# Patient Record
Sex: Female | Born: 1950 | Race: White | Hispanic: No | Marital: Married
Health system: Southern US, Community
[De-identification: ages and names within clinical notes are randomized; demographics above are authoritative.]

## PROBLEM LIST (undated history)

## (undated) DIAGNOSIS — E785 Hyperlipidemia, unspecified: Secondary | ICD-10-CM

## (undated) DIAGNOSIS — I10 Essential (primary) hypertension: Secondary | ICD-10-CM

## (undated) DIAGNOSIS — N200 Calculus of kidney: Secondary | ICD-10-CM

## (undated) DIAGNOSIS — C50919 Malignant neoplasm of unspecified site of unspecified female breast: Secondary | ICD-10-CM

## (undated) HISTORY — PX: TONSILLECTOMY: SUR1361

## (undated) HISTORY — PX: MASTECTOMY: SHX3

---

## 1977-06-12 HISTORY — PX: DILATION AND CURETTAGE OF UTERUS: SHX78

## 1984-06-12 HISTORY — PX: TUBAL LIGATION: SHX77

## 2006-06-12 HISTORY — PX: CYSTOSCOPY: SUR368

## 2017-07-18 DIAGNOSIS — Z79899 Other long term (current) drug therapy: Secondary | ICD-10-CM | POA: Diagnosis not present

## 2017-07-25 DIAGNOSIS — Z853 Personal history of malignant neoplasm of breast: Secondary | ICD-10-CM | POA: Diagnosis not present

## 2017-07-25 DIAGNOSIS — R079 Chest pain, unspecified: Secondary | ICD-10-CM | POA: Diagnosis not present

## 2017-07-25 DIAGNOSIS — I341 Nonrheumatic mitral (valve) prolapse: Secondary | ICD-10-CM | POA: Diagnosis not present

## 2017-07-25 DIAGNOSIS — Z780 Asymptomatic menopausal state: Secondary | ICD-10-CM | POA: Diagnosis not present

## 2017-07-25 DIAGNOSIS — R002 Palpitations: Secondary | ICD-10-CM | POA: Diagnosis not present

## 2017-08-15 DIAGNOSIS — I341 Nonrheumatic mitral (valve) prolapse: Secondary | ICD-10-CM | POA: Diagnosis not present

## 2017-08-15 DIAGNOSIS — R002 Palpitations: Secondary | ICD-10-CM | POA: Diagnosis not present

## 2017-08-15 DIAGNOSIS — R079 Chest pain, unspecified: Secondary | ICD-10-CM | POA: Diagnosis not present

## 2017-08-27 DIAGNOSIS — R079 Chest pain, unspecified: Secondary | ICD-10-CM | POA: Diagnosis not present

## 2017-08-27 DIAGNOSIS — R05 Cough: Secondary | ICD-10-CM | POA: Diagnosis not present

## 2018-01-02 DIAGNOSIS — H04123 Dry eye syndrome of bilateral lacrimal glands: Secondary | ICD-10-CM | POA: Diagnosis not present

## 2018-01-02 DIAGNOSIS — H5789 Other specified disorders of eye and adnexa: Secondary | ICD-10-CM | POA: Diagnosis not present

## 2018-01-02 DIAGNOSIS — H0289 Other specified disorders of eyelid: Secondary | ICD-10-CM | POA: Diagnosis not present

## 2018-01-02 DIAGNOSIS — H5712 Ocular pain, left eye: Secondary | ICD-10-CM | POA: Diagnosis not present

## 2018-01-30 DIAGNOSIS — H53001 Unspecified amblyopia, right eye: Secondary | ICD-10-CM | POA: Diagnosis not present

## 2018-01-30 DIAGNOSIS — H43813 Vitreous degeneration, bilateral: Secondary | ICD-10-CM | POA: Diagnosis not present

## 2018-01-30 DIAGNOSIS — H524 Presbyopia: Secondary | ICD-10-CM | POA: Diagnosis not present

## 2018-01-30 DIAGNOSIS — H2513 Age-related nuclear cataract, bilateral: Secondary | ICD-10-CM | POA: Diagnosis not present

## 2018-01-30 DIAGNOSIS — H52223 Regular astigmatism, bilateral: Secondary | ICD-10-CM | POA: Diagnosis not present

## 2018-03-26 DIAGNOSIS — M1710 Unilateral primary osteoarthritis, unspecified knee: Secondary | ICD-10-CM | POA: Diagnosis not present

## 2018-04-17 DIAGNOSIS — R222 Localized swelling, mass and lump, trunk: Secondary | ICD-10-CM | POA: Diagnosis not present

## 2018-05-15 DIAGNOSIS — Z86010 Personal history of colonic polyps: Secondary | ICD-10-CM | POA: Diagnosis not present

## 2018-05-15 DIAGNOSIS — D123 Benign neoplasm of transverse colon: Secondary | ICD-10-CM | POA: Diagnosis not present

## 2018-05-28 DIAGNOSIS — R222 Localized swelling, mass and lump, trunk: Secondary | ICD-10-CM | POA: Diagnosis not present

## 2018-05-30 ENCOUNTER — Other Ambulatory Visit: Payer: Self-pay | Admitting: General Surgery

## 2018-05-30 DIAGNOSIS — R222 Localized swelling, mass and lump, trunk: Secondary | ICD-10-CM

## 2018-06-06 ENCOUNTER — Other Ambulatory Visit: Payer: Self-pay

## 2018-06-21 ENCOUNTER — Ambulatory Visit
Admission: RE | Admit: 2018-06-21 | Discharge: 2018-06-21 | Disposition: A | Discharge status: Home / Self Care | Payer: Medicare Other | Source: Ambulatory Visit | Attending: General Surgery | Admitting: General Surgery

## 2018-06-21 DIAGNOSIS — Z853 Personal history of malignant neoplasm of breast: Secondary | ICD-10-CM | POA: Diagnosis not present

## 2018-06-21 DIAGNOSIS — R222 Localized swelling, mass and lump, trunk: Secondary | ICD-10-CM

## 2018-07-20 DIAGNOSIS — H6091 Unspecified otitis externa, right ear: Secondary | ICD-10-CM | POA: Diagnosis not present

## 2018-07-29 DIAGNOSIS — Z79899 Other long term (current) drug therapy: Secondary | ICD-10-CM | POA: Diagnosis not present

## 2018-07-29 DIAGNOSIS — R8290 Unspecified abnormal findings in urine: Secondary | ICD-10-CM | POA: Diagnosis not present

## 2018-07-29 DIAGNOSIS — Z01419 Encounter for gynecological examination (general) (routine) without abnormal findings: Secondary | ICD-10-CM | POA: Diagnosis not present

## 2018-07-30 DIAGNOSIS — R030 Elevated blood-pressure reading, without diagnosis of hypertension: Secondary | ICD-10-CM | POA: Diagnosis not present

## 2018-07-30 DIAGNOSIS — Z0000 Encounter for general adult medical examination without abnormal findings: Secondary | ICD-10-CM | POA: Diagnosis not present

## 2018-07-30 DIAGNOSIS — H6691 Otitis media, unspecified, right ear: Secondary | ICD-10-CM | POA: Diagnosis not present

## 2018-08-13 DIAGNOSIS — R030 Elevated blood-pressure reading, without diagnosis of hypertension: Secondary | ICD-10-CM | POA: Diagnosis not present

## 2018-08-13 DIAGNOSIS — Z1389 Encounter for screening for other disorder: Secondary | ICD-10-CM | POA: Diagnosis not present

## 2018-08-13 DIAGNOSIS — N200 Calculus of kidney: Secondary | ICD-10-CM | POA: Diagnosis not present

## 2018-08-13 DIAGNOSIS — M8580 Other specified disorders of bone density and structure, unspecified site: Secondary | ICD-10-CM | POA: Diagnosis not present

## 2018-08-13 DIAGNOSIS — I341 Nonrheumatic mitral (valve) prolapse: Secondary | ICD-10-CM | POA: Diagnosis not present

## 2018-08-13 DIAGNOSIS — C50919 Malignant neoplasm of unspecified site of unspecified female breast: Secondary | ICD-10-CM | POA: Diagnosis not present

## 2018-11-14 DIAGNOSIS — D1801 Hemangioma of skin and subcutaneous tissue: Secondary | ICD-10-CM | POA: Diagnosis not present

## 2018-11-14 DIAGNOSIS — Z1283 Encounter for screening for malignant neoplasm of skin: Secondary | ICD-10-CM | POA: Diagnosis not present

## 2018-11-14 DIAGNOSIS — L818 Other specified disorders of pigmentation: Secondary | ICD-10-CM | POA: Diagnosis not present

## 2018-11-14 DIAGNOSIS — L820 Inflamed seborrheic keratosis: Secondary | ICD-10-CM | POA: Diagnosis not present

## 2018-11-14 DIAGNOSIS — D2261 Melanocytic nevi of right upper limb, including shoulder: Secondary | ICD-10-CM | POA: Diagnosis not present

## 2019-01-21 DIAGNOSIS — L820 Inflamed seborrheic keratosis: Secondary | ICD-10-CM | POA: Diagnosis not present

## 2019-02-18 DIAGNOSIS — I10 Essential (primary) hypertension: Secondary | ICD-10-CM | POA: Diagnosis not present

## 2019-02-18 DIAGNOSIS — C50919 Malignant neoplasm of unspecified site of unspecified female breast: Secondary | ICD-10-CM | POA: Diagnosis not present

## 2019-02-18 DIAGNOSIS — Z23 Encounter for immunization: Secondary | ICD-10-CM | POA: Diagnosis not present

## 2019-02-18 DIAGNOSIS — M8580 Other specified disorders of bone density and structure, unspecified site: Secondary | ICD-10-CM | POA: Diagnosis not present

## 2019-02-18 DIAGNOSIS — I341 Nonrheumatic mitral (valve) prolapse: Secondary | ICD-10-CM | POA: Diagnosis not present

## 2019-02-18 DIAGNOSIS — Z136 Encounter for screening for cardiovascular disorders: Secondary | ICD-10-CM | POA: Diagnosis not present

## 2019-04-25 DIAGNOSIS — Z20828 Contact with and (suspected) exposure to other viral communicable diseases: Secondary | ICD-10-CM | POA: Diagnosis not present

## 2019-07-21 ENCOUNTER — Ambulatory Visit: Payer: Medicare Other | Attending: Internal Medicine

## 2019-07-21 DIAGNOSIS — Z23 Encounter for immunization: Secondary | ICD-10-CM

## 2019-07-21 NOTE — Progress Notes (Signed)
   Covid-19 Vaccination Clinic  Name:  Tracy Barnett    MRN: [id number] DOB: [birth year]  07/21/2019  [name] was observed post Covid-19 immunization for 15 minutes without incidence. She was provided with Vaccine Information Sheet and instruction to access the V-Safe system.   [name] was instructed to call 911 with any severe reactions post vaccine: Marland Kitchen Difficulty breathing  . Swelling of your face and throat  . A fast heartbeat  . A bad rash all over your body  . Dizziness and weakness    Immunizations Administered    Name Date Dose VIS Date Route   Pfizer COVID-19 Vaccine 07/21/2019  9:48 AM 0.3 mL 05/23/2019 Intramuscular   Manufacturer: [place]   Lot: [id number]   [place]: [id number]

## 2019-07-23 DIAGNOSIS — Z01419 Encounter for gynecological examination (general) (routine) without abnormal findings: Secondary | ICD-10-CM | POA: Diagnosis not present

## 2019-07-23 DIAGNOSIS — Z6827 Body mass index (BMI) 27.0-27.9, adult: Secondary | ICD-10-CM | POA: Diagnosis not present

## 2019-08-13 ENCOUNTER — Ambulatory Visit: Payer: Medicare Other | Attending: Internal Medicine

## 2019-08-13 DIAGNOSIS — Z23 Encounter for immunization: Secondary | ICD-10-CM | POA: Insufficient documentation

## 2019-08-13 NOTE — Progress Notes (Signed)
   Covid-19 Vaccination Clinic  Name:  Tracy Barnett    MRN: [id number] DOB: Nov 19, 1950  08/13/2019  [name] was observed post Covid-19 immunization for 15 minutes without incident. She was provided with Vaccine Information Sheet and instruction to access the V-Safe system.   [name] was instructed to call 911 with any severe reactions post vaccine: Marland Kitchen Difficulty breathing  . Swelling of face and throat  . A fast heartbeat  . A bad rash all over body  . Dizziness and weakness   Immunizations Administered    Name Date Dose VIS Date Route   Pfizer COVID-19 Vaccine 08/13/2019 10:13 AM 0.3 mL 05/23/2019 Intramuscular   Manufacturer: [place]   Lot: [id number]   [place]: [id number]

## 2019-11-13 DIAGNOSIS — L820 Inflamed seborrheic keratosis: Secondary | ICD-10-CM | POA: Diagnosis not present

## 2019-11-13 DIAGNOSIS — L309 Dermatitis, unspecified: Secondary | ICD-10-CM | POA: Diagnosis not present

## 2019-11-13 DIAGNOSIS — L821 Other seborrheic keratosis: Secondary | ICD-10-CM | POA: Diagnosis not present

## 2019-11-13 DIAGNOSIS — B079 Viral wart, unspecified: Secondary | ICD-10-CM | POA: Diagnosis not present

## 2019-11-13 DIAGNOSIS — Z1283 Encounter for screening for malignant neoplasm of skin: Secondary | ICD-10-CM | POA: Diagnosis not present

## 2019-11-13 DIAGNOSIS — D1801 Hemangioma of skin and subcutaneous tissue: Secondary | ICD-10-CM | POA: Diagnosis not present

## 2020-03-05 DIAGNOSIS — Z23 Encounter for immunization: Secondary | ICD-10-CM | POA: Diagnosis not present

## 2020-04-01 DIAGNOSIS — Z23 Encounter for immunization: Secondary | ICD-10-CM | POA: Diagnosis not present

## 2020-06-25 DIAGNOSIS — H2513 Age-related nuclear cataract, bilateral: Secondary | ICD-10-CM | POA: Diagnosis not present

## 2020-06-25 DIAGNOSIS — H5212 Myopia, left eye: Secondary | ICD-10-CM | POA: Diagnosis not present

## 2020-06-25 DIAGNOSIS — H353131 Nonexudative age-related macular degeneration, bilateral, early dry stage: Secondary | ICD-10-CM | POA: Diagnosis not present

## 2020-08-10 ENCOUNTER — Ambulatory Visit
Admission: RE | Admit: 2020-08-10 | Discharge: 2020-08-10 | Disposition: A | Discharge status: Home / Self Care | Payer: Medicare Other | Source: Ambulatory Visit | Attending: Internal Medicine | Admitting: Internal Medicine

## 2020-08-10 ENCOUNTER — Other Ambulatory Visit: Payer: Self-pay | Admitting: Internal Medicine

## 2020-08-10 DIAGNOSIS — M25559 Pain in unspecified hip: Secondary | ICD-10-CM

## 2020-08-10 DIAGNOSIS — M25552 Pain in left hip: Secondary | ICD-10-CM | POA: Diagnosis not present

## 2020-08-10 DIAGNOSIS — M7062 Trochanteric bursitis, left hip: Secondary | ICD-10-CM | POA: Diagnosis not present

## 2020-09-19 DIAGNOSIS — I10 Essential (primary) hypertension: Secondary | ICD-10-CM | POA: Diagnosis not present

## 2020-09-19 DIAGNOSIS — F514 Sleep terrors [night terrors]: Secondary | ICD-10-CM | POA: Diagnosis not present

## 2020-09-22 DIAGNOSIS — R002 Palpitations: Secondary | ICD-10-CM | POA: Diagnosis not present

## 2020-09-22 DIAGNOSIS — I10 Essential (primary) hypertension: Secondary | ICD-10-CM | POA: Diagnosis not present

## 2020-09-22 DIAGNOSIS — F411 Generalized anxiety disorder: Secondary | ICD-10-CM | POA: Diagnosis not present

## 2020-10-12 DIAGNOSIS — I10 Essential (primary) hypertension: Secondary | ICD-10-CM | POA: Diagnosis not present

## 2020-10-12 DIAGNOSIS — F411 Generalized anxiety disorder: Secondary | ICD-10-CM | POA: Diagnosis not present

## 2020-11-26 DIAGNOSIS — D229 Melanocytic nevi, unspecified: Secondary | ICD-10-CM | POA: Diagnosis not present

## 2020-11-26 DIAGNOSIS — D1801 Hemangioma of skin and subcutaneous tissue: Secondary | ICD-10-CM | POA: Diagnosis not present

## 2020-11-26 DIAGNOSIS — Z1283 Encounter for screening for malignant neoplasm of skin: Secondary | ICD-10-CM | POA: Diagnosis not present

## 2020-11-26 DIAGNOSIS — L820 Inflamed seborrheic keratosis: Secondary | ICD-10-CM | POA: Diagnosis not present

## 2020-11-26 DIAGNOSIS — L821 Other seborrheic keratosis: Secondary | ICD-10-CM | POA: Diagnosis not present

## 2020-12-10 DIAGNOSIS — Z853 Personal history of malignant neoplasm of breast: Secondary | ICD-10-CM | POA: Diagnosis not present

## 2020-12-10 DIAGNOSIS — M859 Disorder of bone density and structure, unspecified: Secondary | ICD-10-CM | POA: Diagnosis not present

## 2020-12-10 DIAGNOSIS — Z0000 Encounter for general adult medical examination without abnormal findings: Secondary | ICD-10-CM | POA: Diagnosis not present

## 2020-12-10 DIAGNOSIS — E785 Hyperlipidemia, unspecified: Secondary | ICD-10-CM | POA: Diagnosis not present

## 2020-12-10 DIAGNOSIS — I10 Essential (primary) hypertension: Secondary | ICD-10-CM | POA: Diagnosis not present

## 2020-12-10 DIAGNOSIS — Z1389 Encounter for screening for other disorder: Secondary | ICD-10-CM | POA: Diagnosis not present

## 2020-12-10 DIAGNOSIS — M8580 Other specified disorders of bone density and structure, unspecified site: Secondary | ICD-10-CM | POA: Diagnosis not present

## 2021-01-17 DIAGNOSIS — R7989 Other specified abnormal findings of blood chemistry: Secondary | ICD-10-CM | POA: Diagnosis not present

## 2021-01-17 DIAGNOSIS — R946 Abnormal results of thyroid function studies: Secondary | ICD-10-CM | POA: Diagnosis not present

## 2021-02-28 ENCOUNTER — Other Ambulatory Visit: Payer: Self-pay | Admitting: Internal Medicine

## 2021-02-28 DIAGNOSIS — M8580 Other specified disorders of bone density and structure, unspecified site: Secondary | ICD-10-CM

## 2021-03-01 ENCOUNTER — Other Ambulatory Visit: Payer: Self-pay | Admitting: Internal Medicine

## 2021-03-01 ENCOUNTER — Other Ambulatory Visit: Payer: Self-pay | Admitting: Nurse Practitioner

## 2021-03-01 DIAGNOSIS — N6489 Other specified disorders of breast: Secondary | ICD-10-CM

## 2021-03-01 DIAGNOSIS — M8580 Other specified disorders of bone density and structure, unspecified site: Secondary | ICD-10-CM

## 2021-03-02 ENCOUNTER — Other Ambulatory Visit: Payer: Self-pay | Admitting: Internal Medicine

## 2021-03-02 DIAGNOSIS — M85852 Other specified disorders of bone density and structure, left thigh: Secondary | ICD-10-CM

## 2021-03-07 ENCOUNTER — Other Ambulatory Visit: Payer: Self-pay

## 2021-03-07 ENCOUNTER — Ambulatory Visit
Admission: RE | Admit: 2021-03-07 | Discharge: 2021-03-07 | Disposition: A | Discharge status: Home / Self Care | Payer: Medicare Other | Source: Ambulatory Visit | Attending: Internal Medicine | Admitting: Internal Medicine

## 2021-03-07 DIAGNOSIS — M85852 Other specified disorders of bone density and structure, left thigh: Secondary | ICD-10-CM | POA: Insufficient documentation

## 2021-03-07 DIAGNOSIS — M8589 Other specified disorders of bone density and structure, multiple sites: Secondary | ICD-10-CM | POA: Diagnosis not present

## 2021-03-10 DIAGNOSIS — Z23 Encounter for immunization: Secondary | ICD-10-CM | POA: Diagnosis not present

## 2021-04-14 IMAGING — DX DG HIP (WITH OR WITHOUT PELVIS) 2-3V*L*
2 series · 2 of 2 positions shown · non-contrast
Comparison: None.

CLINICAL DATA: Left hip pain for 6 weeks.

EXAM:
DG HIP (WITH OR WITHOUT PELVIS) 2-3V LEFT

[dg hip unilat w or w/o pelvis 2-3 views  (1 of 2)]
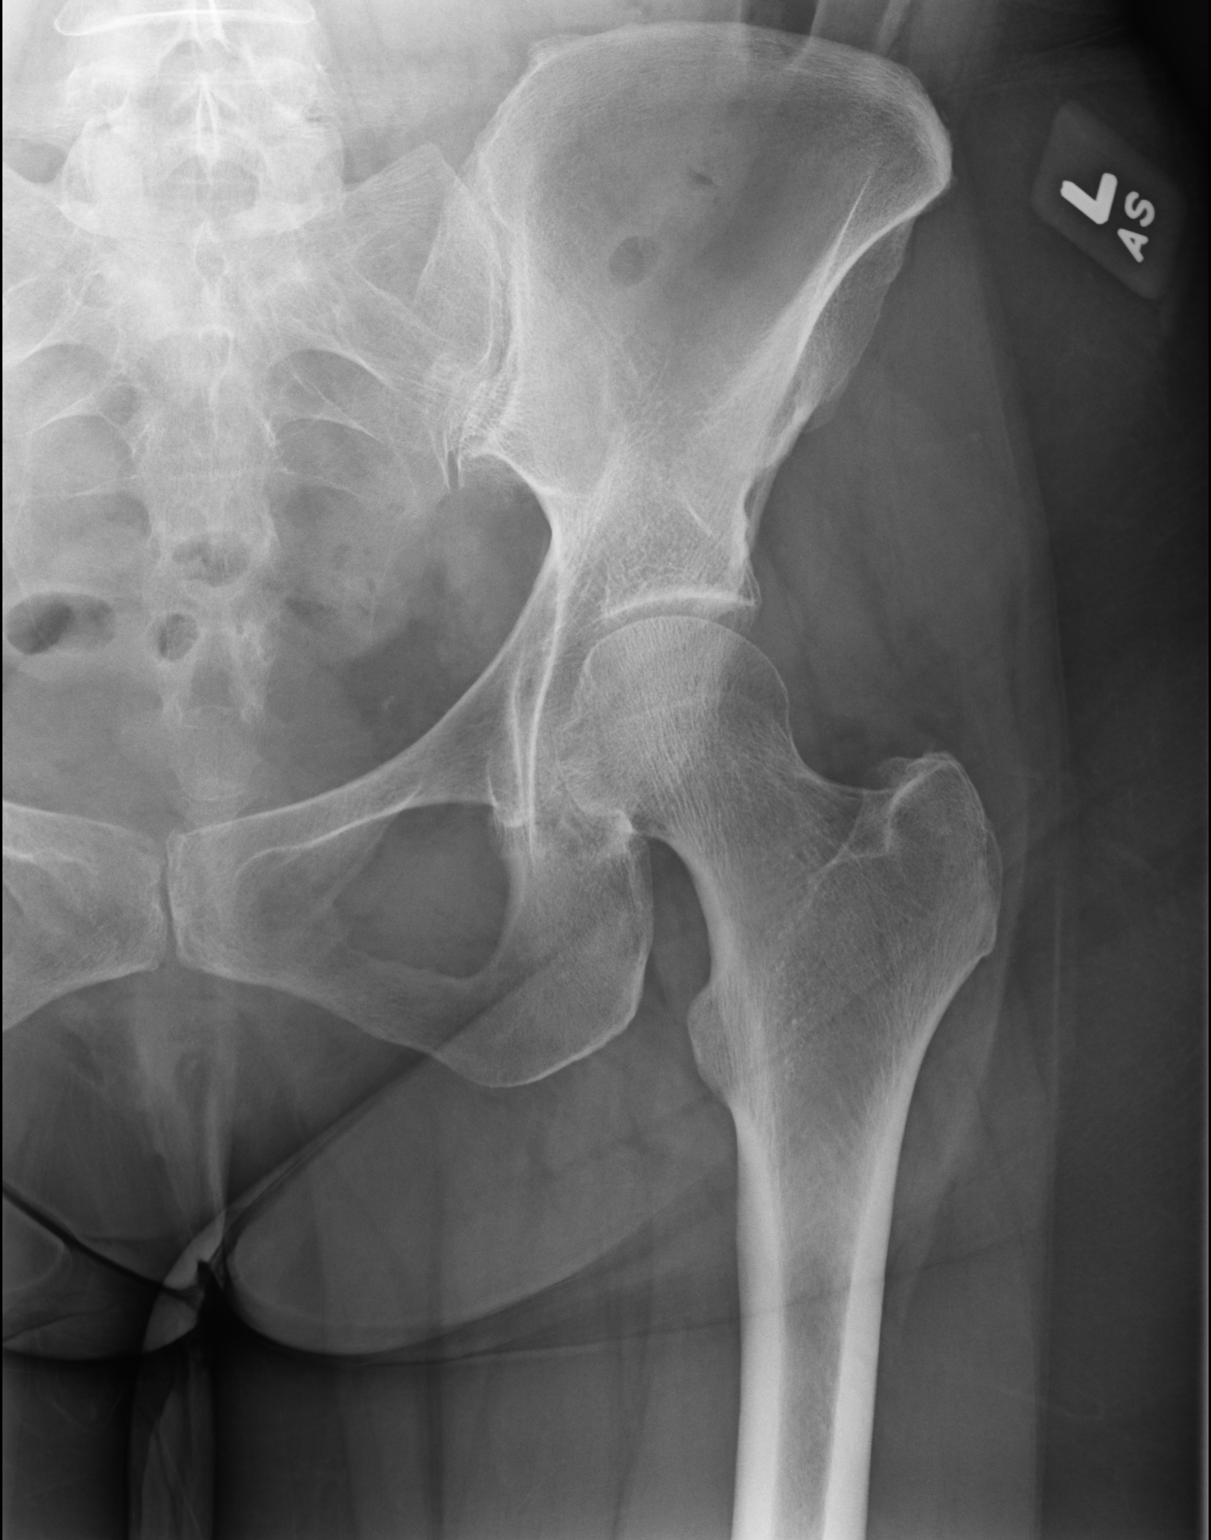

[dg hip unilat w or w/o pelvis 2-3 views  (2 of 2)]
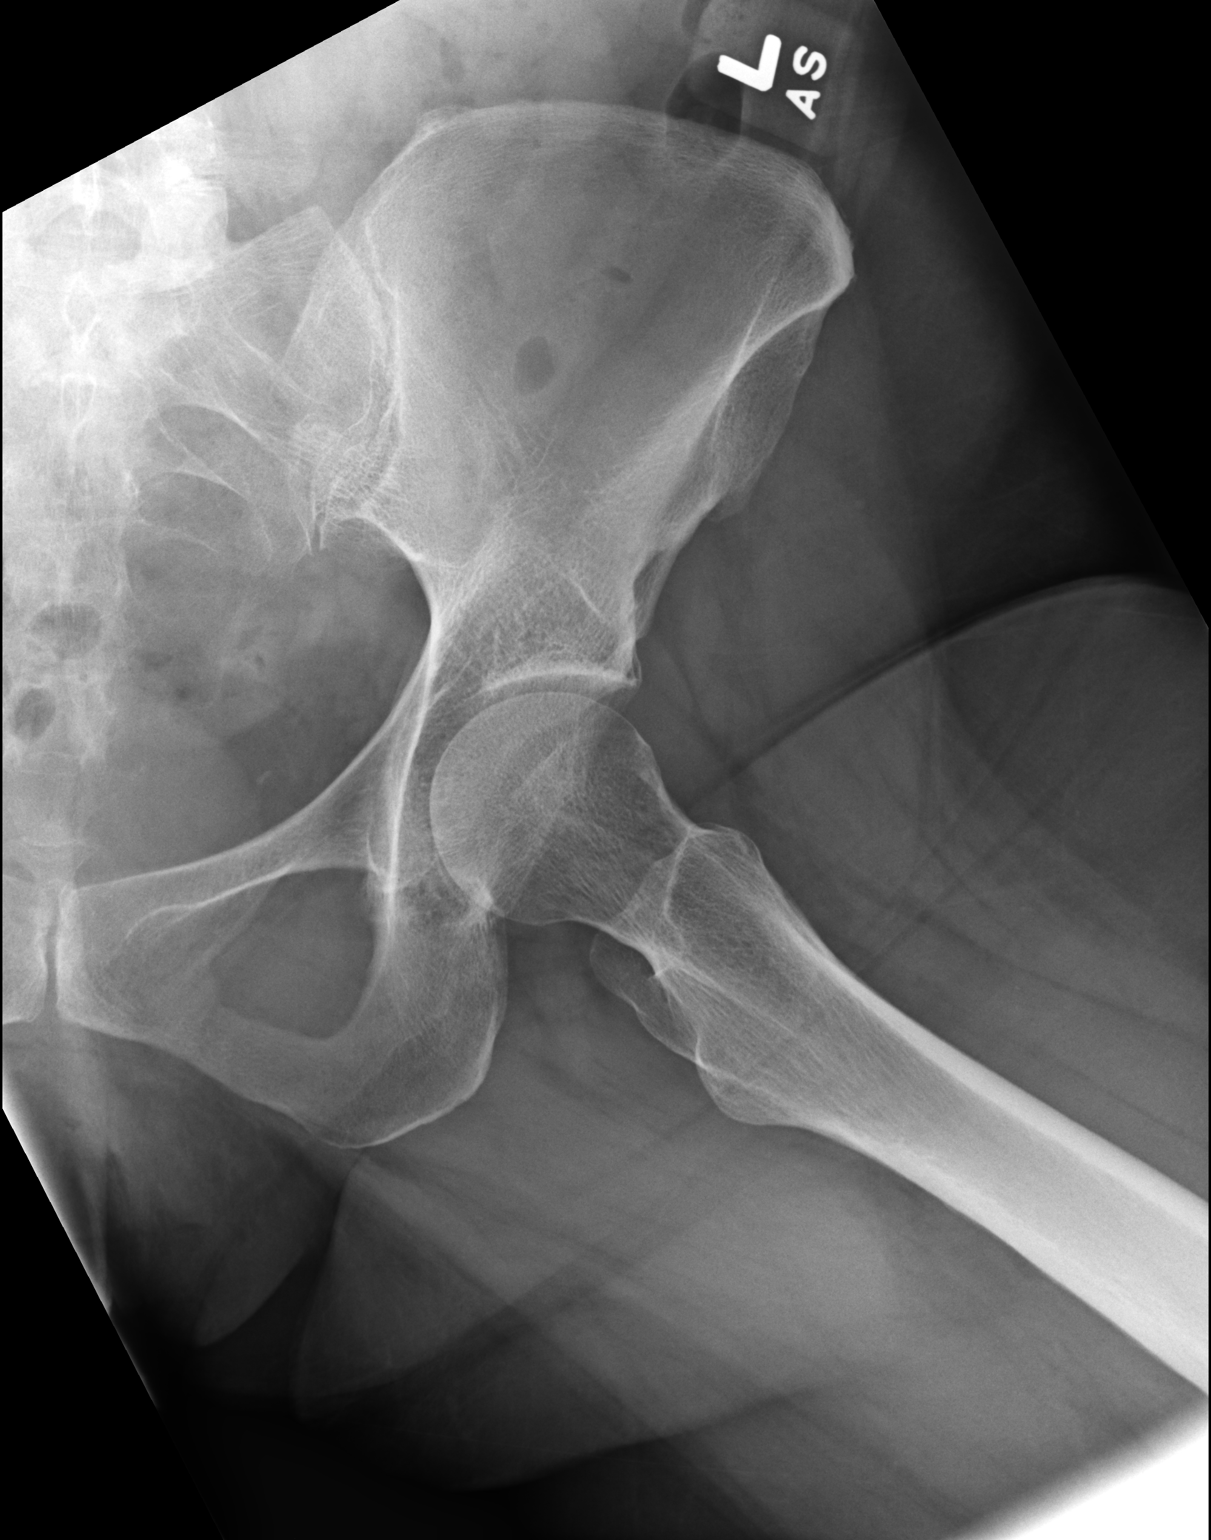

[2 of 2 positions shown; findings below may reference images not displayed]

FINDINGS: AP and frogleg lateral views of the left hip obtained. Left hip
joint space is preserved. No significant osteophytes. Femoral head
is well seated. No fracture, evidence of a vascular necrosis, or
focal bone lesion. Pubic rami are intact. The pubic symphysis is
congruent. Left sacroiliac joint is normal. No soft tissue
abnormalities.
IMPRESSION: Negative radiographs of the left hip.

## 2021-06-12 HISTORY — PX: CATARACT EXTRACTION: SUR2

## 2021-06-23 DIAGNOSIS — Z23 Encounter for immunization: Secondary | ICD-10-CM | POA: Diagnosis not present

## 2021-06-27 DIAGNOSIS — H353131 Nonexudative age-related macular degeneration, bilateral, early dry stage: Secondary | ICD-10-CM | POA: Diagnosis not present

## 2021-06-27 DIAGNOSIS — H2513 Age-related nuclear cataract, bilateral: Secondary | ICD-10-CM | POA: Diagnosis not present

## 2021-06-27 DIAGNOSIS — H5212 Myopia, left eye: Secondary | ICD-10-CM | POA: Diagnosis not present

## 2021-07-27 DIAGNOSIS — Z124 Encounter for screening for malignant neoplasm of cervix: Secondary | ICD-10-CM | POA: Diagnosis not present

## 2021-07-27 DIAGNOSIS — N952 Postmenopausal atrophic vaginitis: Secondary | ICD-10-CM | POA: Diagnosis not present

## 2021-07-27 DIAGNOSIS — Z01419 Encounter for gynecological examination (general) (routine) without abnormal findings: Secondary | ICD-10-CM | POA: Diagnosis not present

## 2021-09-10 DIAGNOSIS — I10 Essential (primary) hypertension: Secondary | ICD-10-CM | POA: Diagnosis not present

## 2021-09-10 DIAGNOSIS — R0789 Other chest pain: Secondary | ICD-10-CM | POA: Diagnosis not present

## 2021-09-20 DIAGNOSIS — E038 Other specified hypothyroidism: Secondary | ICD-10-CM | POA: Diagnosis not present

## 2021-09-20 DIAGNOSIS — I10 Essential (primary) hypertension: Secondary | ICD-10-CM | POA: Diagnosis not present

## 2021-09-20 DIAGNOSIS — G250 Essential tremor: Secondary | ICD-10-CM | POA: Diagnosis not present

## 2021-12-22 DIAGNOSIS — E038 Other specified hypothyroidism: Secondary | ICD-10-CM | POA: Diagnosis not present

## 2021-12-22 DIAGNOSIS — I341 Nonrheumatic mitral (valve) prolapse: Secondary | ICD-10-CM | POA: Diagnosis not present

## 2021-12-22 DIAGNOSIS — G250 Essential tremor: Secondary | ICD-10-CM | POA: Diagnosis not present

## 2021-12-22 DIAGNOSIS — M8580 Other specified disorders of bone density and structure, unspecified site: Secondary | ICD-10-CM | POA: Diagnosis not present

## 2021-12-22 DIAGNOSIS — I10 Essential (primary) hypertension: Secondary | ICD-10-CM | POA: Diagnosis not present

## 2021-12-22 DIAGNOSIS — F411 Generalized anxiety disorder: Secondary | ICD-10-CM | POA: Diagnosis not present

## 2021-12-22 DIAGNOSIS — E785 Hyperlipidemia, unspecified: Secondary | ICD-10-CM | POA: Diagnosis not present

## 2021-12-22 DIAGNOSIS — Z0000 Encounter for general adult medical examination without abnormal findings: Secondary | ICD-10-CM | POA: Diagnosis not present

## 2021-12-22 DIAGNOSIS — Z853 Personal history of malignant neoplasm of breast: Secondary | ICD-10-CM | POA: Diagnosis not present

## 2021-12-30 DIAGNOSIS — H2512 Age-related nuclear cataract, left eye: Secondary | ICD-10-CM | POA: Diagnosis not present

## 2022-01-12 DIAGNOSIS — Z1283 Encounter for screening for malignant neoplasm of skin: Secondary | ICD-10-CM | POA: Diagnosis not present

## 2022-01-12 DIAGNOSIS — L570 Actinic keratosis: Secondary | ICD-10-CM | POA: Diagnosis not present

## 2022-01-12 DIAGNOSIS — D2361 Other benign neoplasm of skin of right upper limb, including shoulder: Secondary | ICD-10-CM | POA: Diagnosis not present

## 2022-01-12 DIAGNOSIS — L820 Inflamed seborrheic keratosis: Secondary | ICD-10-CM | POA: Diagnosis not present

## 2022-01-12 DIAGNOSIS — L821 Other seborrheic keratosis: Secondary | ICD-10-CM | POA: Diagnosis not present

## 2022-01-12 DIAGNOSIS — D1801 Hemangioma of skin and subcutaneous tissue: Secondary | ICD-10-CM | POA: Diagnosis not present

## 2022-01-18 DIAGNOSIS — H269 Unspecified cataract: Secondary | ICD-10-CM | POA: Diagnosis not present

## 2022-01-18 DIAGNOSIS — H2512 Age-related nuclear cataract, left eye: Secondary | ICD-10-CM | POA: Diagnosis not present

## 2022-03-06 DIAGNOSIS — Z23 Encounter for immunization: Secondary | ICD-10-CM | POA: Diagnosis not present

## 2022-03-10 DIAGNOSIS — H04123 Dry eye syndrome of bilateral lacrimal glands: Secondary | ICD-10-CM | POA: Diagnosis not present

## 2022-06-29 DIAGNOSIS — I341 Nonrheumatic mitral (valve) prolapse: Secondary | ICD-10-CM | POA: Diagnosis not present

## 2022-06-29 DIAGNOSIS — Z8249 Family history of ischemic heart disease and other diseases of the circulatory system: Secondary | ICD-10-CM | POA: Diagnosis not present

## 2022-06-29 DIAGNOSIS — F411 Generalized anxiety disorder: Secondary | ICD-10-CM | POA: Diagnosis not present

## 2022-06-29 DIAGNOSIS — Z853 Personal history of malignant neoplasm of breast: Secondary | ICD-10-CM | POA: Diagnosis not present

## 2022-06-29 DIAGNOSIS — H9190 Unspecified hearing loss, unspecified ear: Secondary | ICD-10-CM | POA: Diagnosis not present

## 2022-06-29 DIAGNOSIS — E785 Hyperlipidemia, unspecified: Secondary | ICD-10-CM | POA: Diagnosis not present

## 2022-06-29 DIAGNOSIS — G250 Essential tremor: Secondary | ICD-10-CM | POA: Diagnosis not present

## 2022-06-29 DIAGNOSIS — E038 Other specified hypothyroidism: Secondary | ICD-10-CM | POA: Diagnosis not present

## 2022-06-29 DIAGNOSIS — I10 Essential (primary) hypertension: Secondary | ICD-10-CM | POA: Diagnosis not present

## 2022-07-04 ENCOUNTER — Other Ambulatory Visit: Payer: Self-pay | Admitting: Internal Medicine

## 2022-07-04 DIAGNOSIS — Z8249 Family history of ischemic heart disease and other diseases of the circulatory system: Secondary | ICD-10-CM

## 2022-07-12 ENCOUNTER — Ambulatory Visit
Admission: RE | Admit: 2022-07-12 | Discharge: 2022-07-12 | Disposition: A | Discharge status: Home / Self Care | Payer: Medicare Other | Source: Ambulatory Visit | Attending: Internal Medicine | Admitting: Internal Medicine

## 2022-07-12 DIAGNOSIS — Z8249 Family history of ischemic heart disease and other diseases of the circulatory system: Secondary | ICD-10-CM

## 2022-07-12 DIAGNOSIS — I700 Atherosclerosis of aorta: Secondary | ICD-10-CM | POA: Diagnosis not present

## 2022-08-29 DIAGNOSIS — H2511 Age-related nuclear cataract, right eye: Secondary | ICD-10-CM | POA: Diagnosis not present

## 2022-08-29 DIAGNOSIS — H353132 Nonexudative age-related macular degeneration, bilateral, intermediate dry stage: Secondary | ICD-10-CM | POA: Diagnosis not present

## 2022-09-29 DIAGNOSIS — H9313 Tinnitus, bilateral: Secondary | ICD-10-CM | POA: Diagnosis not present

## 2022-09-29 DIAGNOSIS — H6121 Impacted cerumen, right ear: Secondary | ICD-10-CM | POA: Diagnosis not present

## 2022-09-29 DIAGNOSIS — H906 Mixed conductive and sensorineural hearing loss, bilateral: Secondary | ICD-10-CM | POA: Diagnosis not present

## 2022-09-29 DIAGNOSIS — H65491 Other chronic nonsuppurative otitis media, right ear: Secondary | ICD-10-CM | POA: Diagnosis not present

## 2022-09-29 DIAGNOSIS — H6991 Unspecified Eustachian tube disorder, right ear: Secondary | ICD-10-CM | POA: Diagnosis not present

## 2022-10-11 DIAGNOSIS — H65491 Other chronic nonsuppurative otitis media, right ear: Secondary | ICD-10-CM | POA: Diagnosis not present

## 2022-10-11 DIAGNOSIS — H906 Mixed conductive and sensorineural hearing loss, bilateral: Secondary | ICD-10-CM | POA: Diagnosis not present

## 2022-11-15 DIAGNOSIS — H65491 Other chronic nonsuppurative otitis media, right ear: Secondary | ICD-10-CM | POA: Diagnosis not present

## 2022-11-15 DIAGNOSIS — Z9622 Myringotomy tube(s) status: Secondary | ICD-10-CM | POA: Diagnosis not present

## 2022-11-15 DIAGNOSIS — M26609 Unspecified temporomandibular joint disorder, unspecified side: Secondary | ICD-10-CM | POA: Diagnosis not present

## 2022-11-15 DIAGNOSIS — H906 Mixed conductive and sensorineural hearing loss, bilateral: Secondary | ICD-10-CM | POA: Diagnosis not present

## 2023-02-13 ENCOUNTER — Other Ambulatory Visit: Payer: Self-pay | Admitting: Internal Medicine

## 2023-02-13 DIAGNOSIS — I10 Essential (primary) hypertension: Secondary | ICD-10-CM | POA: Diagnosis not present

## 2023-02-13 DIAGNOSIS — F411 Generalized anxiety disorder: Secondary | ICD-10-CM | POA: Diagnosis not present

## 2023-02-13 DIAGNOSIS — K769 Liver disease, unspecified: Secondary | ICD-10-CM | POA: Diagnosis not present

## 2023-02-13 DIAGNOSIS — Z853 Personal history of malignant neoplasm of breast: Secondary | ICD-10-CM | POA: Diagnosis not present

## 2023-02-13 DIAGNOSIS — G250 Essential tremor: Secondary | ICD-10-CM | POA: Diagnosis not present

## 2023-02-13 DIAGNOSIS — I341 Nonrheumatic mitral (valve) prolapse: Secondary | ICD-10-CM | POA: Diagnosis not present

## 2023-02-13 DIAGNOSIS — Z0000 Encounter for general adult medical examination without abnormal findings: Secondary | ICD-10-CM | POA: Diagnosis not present

## 2023-02-13 DIAGNOSIS — E785 Hyperlipidemia, unspecified: Secondary | ICD-10-CM | POA: Diagnosis not present

## 2023-02-13 DIAGNOSIS — M8580 Other specified disorders of bone density and structure, unspecified site: Secondary | ICD-10-CM | POA: Diagnosis not present

## 2023-02-13 DIAGNOSIS — E038 Other specified hypothyroidism: Secondary | ICD-10-CM | POA: Diagnosis not present

## 2023-02-14 DIAGNOSIS — B351 Tinea unguium: Secondary | ICD-10-CM | POA: Diagnosis not present

## 2023-02-14 DIAGNOSIS — D1801 Hemangioma of skin and subcutaneous tissue: Secondary | ICD-10-CM | POA: Diagnosis not present

## 2023-02-14 DIAGNOSIS — L820 Inflamed seborrheic keratosis: Secondary | ICD-10-CM | POA: Diagnosis not present

## 2023-02-14 DIAGNOSIS — L570 Actinic keratosis: Secondary | ICD-10-CM | POA: Diagnosis not present

## 2023-02-14 DIAGNOSIS — L578 Other skin changes due to chronic exposure to nonionizing radiation: Secondary | ICD-10-CM | POA: Diagnosis not present

## 2023-02-14 DIAGNOSIS — L298 Other pruritus: Secondary | ICD-10-CM | POA: Diagnosis not present

## 2023-02-14 DIAGNOSIS — L538 Other specified erythematous conditions: Secondary | ICD-10-CM | POA: Diagnosis not present

## 2023-02-14 DIAGNOSIS — L821 Other seborrheic keratosis: Secondary | ICD-10-CM | POA: Diagnosis not present

## 2023-02-15 ENCOUNTER — Ambulatory Visit
Admission: RE | Admit: 2023-02-15 | Discharge: 2023-02-15 | Disposition: A | Discharge status: Home / Self Care | Payer: Medicare Other | Source: Ambulatory Visit | Attending: Internal Medicine | Admitting: Internal Medicine

## 2023-02-15 DIAGNOSIS — K769 Liver disease, unspecified: Secondary | ICD-10-CM

## 2023-02-15 DIAGNOSIS — K7689 Other specified diseases of liver: Secondary | ICD-10-CM | POA: Diagnosis not present

## 2023-02-15 DIAGNOSIS — K8020 Calculus of gallbladder without cholecystitis without obstruction: Secondary | ICD-10-CM | POA: Diagnosis not present

## 2023-03-26 DIAGNOSIS — Z23 Encounter for immunization: Secondary | ICD-10-CM | POA: Diagnosis not present

## 2023-05-01 DIAGNOSIS — Z9622 Myringotomy tube(s) status: Secondary | ICD-10-CM | POA: Diagnosis not present

## 2023-05-01 DIAGNOSIS — H65491 Other chronic nonsuppurative otitis media, right ear: Secondary | ICD-10-CM | POA: Diagnosis not present

## 2023-05-29 DIAGNOSIS — Z860101 Personal history of adenomatous and serrated colon polyps: Secondary | ICD-10-CM | POA: Diagnosis not present

## 2023-05-29 DIAGNOSIS — D126 Benign neoplasm of colon, unspecified: Secondary | ICD-10-CM | POA: Diagnosis not present

## 2023-05-29 DIAGNOSIS — Z860100 Personal history of colon polyps, unspecified: Secondary | ICD-10-CM | POA: Diagnosis not present

## 2023-05-29 DIAGNOSIS — K635 Polyp of colon: Secondary | ICD-10-CM | POA: Diagnosis not present

## 2023-05-29 DIAGNOSIS — Z1211 Encounter for screening for malignant neoplasm of colon: Secondary | ICD-10-CM | POA: Diagnosis not present

## 2023-06-27 DIAGNOSIS — M79672 Pain in left foot: Secondary | ICD-10-CM | POA: Diagnosis not present

## 2023-07-31 DIAGNOSIS — Z1330 Encounter for screening examination for mental health and behavioral disorders, unspecified: Secondary | ICD-10-CM | POA: Diagnosis not present

## 2023-07-31 DIAGNOSIS — N952 Postmenopausal atrophic vaginitis: Secondary | ICD-10-CM | POA: Diagnosis not present

## 2023-07-31 DIAGNOSIS — Z124 Encounter for screening for malignant neoplasm of cervix: Secondary | ICD-10-CM | POA: Diagnosis not present

## 2023-07-31 DIAGNOSIS — Z853 Personal history of malignant neoplasm of breast: Secondary | ICD-10-CM | POA: Diagnosis not present

## 2023-07-31 DIAGNOSIS — Z01419 Encounter for gynecological examination (general) (routine) without abnormal findings: Secondary | ICD-10-CM | POA: Diagnosis not present

## 2023-08-14 DIAGNOSIS — Z23 Encounter for immunization: Secondary | ICD-10-CM | POA: Diagnosis not present

## 2023-08-14 DIAGNOSIS — E785 Hyperlipidemia, unspecified: Secondary | ICD-10-CM | POA: Diagnosis not present

## 2023-08-14 DIAGNOSIS — E038 Other specified hypothyroidism: Secondary | ICD-10-CM | POA: Diagnosis not present

## 2023-08-14 DIAGNOSIS — I341 Nonrheumatic mitral (valve) prolapse: Secondary | ICD-10-CM | POA: Diagnosis not present

## 2023-08-14 DIAGNOSIS — Z853 Personal history of malignant neoplasm of breast: Secondary | ICD-10-CM | POA: Diagnosis not present

## 2023-08-14 DIAGNOSIS — I10 Essential (primary) hypertension: Secondary | ICD-10-CM | POA: Diagnosis not present

## 2023-08-24 DIAGNOSIS — H906 Mixed conductive and sensorineural hearing loss, bilateral: Secondary | ICD-10-CM | POA: Diagnosis not present

## 2023-08-24 DIAGNOSIS — H65491 Other chronic nonsuppurative otitis media, right ear: Secondary | ICD-10-CM | POA: Diagnosis not present

## 2023-08-24 DIAGNOSIS — Z9622 Myringotomy tube(s) status: Secondary | ICD-10-CM | POA: Diagnosis not present

## 2023-08-24 DIAGNOSIS — H6121 Impacted cerumen, right ear: Secondary | ICD-10-CM | POA: Diagnosis not present

## 2023-08-31 DIAGNOSIS — H524 Presbyopia: Secondary | ICD-10-CM | POA: Diagnosis not present

## 2023-08-31 DIAGNOSIS — H2511 Age-related nuclear cataract, right eye: Secondary | ICD-10-CM | POA: Diagnosis not present

## 2023-08-31 DIAGNOSIS — D23112 Other benign neoplasm of skin of right lower eyelid, including canthus: Secondary | ICD-10-CM | POA: Diagnosis not present

## 2023-09-13 DIAGNOSIS — M722 Plantar fascial fibromatosis: Secondary | ICD-10-CM | POA: Diagnosis not present

## 2023-10-15 DIAGNOSIS — M6702 Short Achilles tendon (acquired), left ankle: Secondary | ICD-10-CM | POA: Diagnosis not present

## 2023-10-15 DIAGNOSIS — M722 Plantar fascial fibromatosis: Secondary | ICD-10-CM | POA: Diagnosis not present

## 2023-10-25 ENCOUNTER — Encounter (HOSPITAL_COMMUNITY): Payer: Self-pay | Admitting: Internal Medicine

## 2023-10-25 DIAGNOSIS — N200 Calculus of kidney: Secondary | ICD-10-CM | POA: Diagnosis not present

## 2023-10-25 DIAGNOSIS — M549 Dorsalgia, unspecified: Secondary | ICD-10-CM | POA: Diagnosis not present

## 2023-10-25 DIAGNOSIS — R350 Frequency of micturition: Secondary | ICD-10-CM | POA: Diagnosis not present

## 2023-10-26 ENCOUNTER — Ambulatory Visit
Admission: RE | Admit: 2023-10-26 | Discharge: 2023-10-26 | Disposition: A | Discharge status: Home / Self Care | Source: Ambulatory Visit | Attending: Internal Medicine | Admitting: Internal Medicine

## 2023-10-26 ENCOUNTER — Other Ambulatory Visit: Payer: Self-pay | Admitting: Internal Medicine

## 2023-10-26 DIAGNOSIS — N200 Calculus of kidney: Secondary | ICD-10-CM | POA: Insufficient documentation

## 2023-10-29 ENCOUNTER — Other Ambulatory Visit: Payer: Self-pay | Admitting: Urology

## 2023-10-29 ENCOUNTER — Inpatient Hospital Stay (HOSPITAL_COMMUNITY)

## 2023-10-29 ENCOUNTER — Inpatient Hospital Stay (HOSPITAL_BASED_OUTPATIENT_CLINIC_OR_DEPARTMENT_OTHER): Admitting: Certified Registered Nurse Anesthetist

## 2023-10-29 ENCOUNTER — Encounter (HOSPITAL_COMMUNITY)
Admission: AD | Disposition: A | Discharge status: Home / Self Care | Payer: Self-pay | Source: Ambulatory Visit | Attending: Urology

## 2023-10-29 ENCOUNTER — Inpatient Hospital Stay (HOSPITAL_COMMUNITY): Admitting: Certified Registered Nurse Anesthetist

## 2023-10-29 ENCOUNTER — Ambulatory Visit (HOSPITAL_COMMUNITY)
Admission: AD | Admit: 2023-10-29 | Discharge: 2023-10-29 | Disposition: A | Discharge status: Home / Self Care | Source: Ambulatory Visit | Attending: Urology | Admitting: Urology

## 2023-10-29 ENCOUNTER — Encounter (HOSPITAL_COMMUNITY): Payer: Self-pay | Admitting: Urology

## 2023-10-29 ENCOUNTER — Other Ambulatory Visit: Payer: Self-pay

## 2023-10-29 DIAGNOSIS — Z87442 Personal history of urinary calculi: Secondary | ICD-10-CM | POA: Insufficient documentation

## 2023-10-29 DIAGNOSIS — N201 Calculus of ureter: Secondary | ICD-10-CM

## 2023-10-29 DIAGNOSIS — K219 Gastro-esophageal reflux disease without esophagitis: Secondary | ICD-10-CM | POA: Diagnosis not present

## 2023-10-29 DIAGNOSIS — I10 Essential (primary) hypertension: Secondary | ICD-10-CM | POA: Diagnosis not present

## 2023-10-29 DIAGNOSIS — N202 Calculus of kidney with calculus of ureter: Secondary | ICD-10-CM | POA: Diagnosis not present

## 2023-10-29 HISTORY — PX: CYSTOSCOPY/URETEROSCOPY/HOLMIUM LASER/STENT PLACEMENT: SHX6546

## 2023-10-29 HISTORY — DX: Malignant neoplasm of unspecified site of unspecified female breast: C50.919

## 2023-10-29 HISTORY — DX: Hyperlipidemia, unspecified: E78.5

## 2023-10-29 HISTORY — DX: Essential (primary) hypertension: I10

## 2023-10-29 HISTORY — DX: Calculus of kidney: N20.0

## 2023-10-29 LAB — BASIC METABOLIC PANEL WITH GFR
Anion gap: 9 (ref 5–15)
BUN: 17 mg/dL (ref 8–23)
CO2: 24 mmol/L (ref 22–32)
Calcium: 9.5 mg/dL (ref 8.9–10.3)
Chloride: 107 mmol/L (ref 98–111)
Creatinine, Ser: 0.9 mg/dL (ref 0.44–1.00)
GFR, Estimated: >60 mL/min (ref >60)
Glucose, Bld: 98 mg/dL (ref 70–99)
Potassium: 3.5 mmol/L (ref 3.5–5.1)
Sodium: 140 mmol/L (ref 135–145)

## 2023-10-29 LAB — CBC
HCT: 43.4 % (ref 36.0–46.0)
Hemoglobin: 13.8 g/dL (ref 12.0–15.0)
MCH: 28.6 pg (ref 26.0–34.0)
MCHC: 31.8 g/dL (ref 30.0–36.0)
MCV: 89.9 fL (ref 80.0–100.0)
Platelets: 208 10*3/uL (ref 150–400)
RBC: 4.83 MIL/uL (ref 3.87–5.11)
RDW: 13.2 % (ref 11.5–15.5)
WBC: 6.9 10*3/uL (ref 4.0–10.5)
nRBC: 0 % (ref 0.0–0.2)

## 2023-10-29 SURGERY — CYSTOSCOPY/URETEROSCOPY/HOLMIUM LASER/STENT PLACEMENT
Anesthesia: General | Laterality: Right

## 2023-10-29 MED ORDER — TAMSULOSIN HCL 0.4 MG PO CAPS: 0.4000 mg | ORAL_CAPSULE | Freq: Every day | ORAL | 0 refills | Status: AC

## 2023-10-29 MED ORDER — ONDANSETRON HCL 4 MG/2ML IJ SOLN
INTRAMUSCULAR | Status: AC
  Filled 2023-10-29: qty 2

## 2023-10-29 MED ORDER — OXYCODONE HCL 5 MG/5ML PO SOLN: 5.0000 mg | Freq: Once | ORAL | Status: DC | PRN

## 2023-10-29 MED ORDER — DROPERIDOL 2.5 MG/ML IJ SOLN: 0.6250 mg | Freq: Once | INTRAMUSCULAR | Status: DC | PRN

## 2023-10-29 MED ORDER — SODIUM CHLORIDE 0.9% FLUSH: 3.0000 mL | INTRAVENOUS | Status: DC | PRN

## 2023-10-29 MED ORDER — ACETAMINOPHEN 325 MG PO TABS: 650.0000 mg | ORAL_TABLET | ORAL | Status: DC | PRN

## 2023-10-29 MED ORDER — OXYCODONE HCL 5 MG PO TABS: 5.0000 mg | ORAL_TABLET | Freq: Once | ORAL | Status: DC | PRN

## 2023-10-29 MED ORDER — DEXAMETHASONE SODIUM PHOSPHATE 10 MG/ML IJ SOLN
INTRAMUSCULAR | Status: AC
  Filled 2023-10-29: qty 1

## 2023-10-29 MED ORDER — SODIUM CHLORIDE 0.9 % IR SOLN
Status: DC | PRN
  Administered 2023-10-29: 3000 mL

## 2023-10-29 MED ORDER — SODIUM CHLORIDE 0.9 % IV SOLN: 250.0000 mL | INTRAVENOUS | Status: DC | PRN

## 2023-10-29 MED ORDER — ONDANSETRON 4 MG PO TBDP: 4.0000 mg | ORAL_TABLET | Freq: Four times a day (QID) | ORAL | 1 refills | Status: AC | PRN

## 2023-10-29 MED ORDER — OXYCODONE-ACETAMINOPHEN 5-325 MG PO TABS: 1.0000 | ORAL_TABLET | ORAL | 0 refills | Status: AC | PRN

## 2023-10-29 MED ORDER — FENTANYL CITRATE (PF) 100 MCG/2ML IJ SOLN
INTRAMUSCULAR | Status: AC
  Filled 2023-10-29: qty 2

## 2023-10-29 MED ORDER — IOHEXOL 300 MG/ML  SOLN
INTRAMUSCULAR | Status: DC | PRN
  Administered 2023-10-29: 1 mL

## 2023-10-29 MED ORDER — LIDOCAINE HCL (PF) 2 % IJ SOLN
INTRAMUSCULAR | Status: AC
  Filled 2023-10-29: qty 5

## 2023-10-29 MED ORDER — SODIUM CHLORIDE 0.9% FLUSH: 3.0000 mL | Freq: Two times a day (BID) | INTRAVENOUS | Status: DC

## 2023-10-29 MED ORDER — ONDANSETRON HCL 4 MG/2ML IJ SOLN
INTRAMUSCULAR | Status: DC | PRN
  Administered 2023-10-29: 4 mg via INTRAVENOUS

## 2023-10-29 MED ORDER — EPHEDRINE SULFATE-NACL 50-0.9 MG/10ML-% IV SOSY
PREFILLED_SYRINGE | INTRAVENOUS | Status: DC | PRN
  Administered 2023-10-29: 5 mg via INTRAVENOUS

## 2023-10-29 MED ORDER — OXYCODONE HCL 5 MG PO TABS: 5.0000 mg | ORAL_TABLET | ORAL | Status: DC | PRN

## 2023-10-29 MED ORDER — PROPOFOL 10 MG/ML IV BOLUS
INTRAVENOUS | Status: DC | PRN
  Administered 2023-10-29: 150 mg via INTRAVENOUS

## 2023-10-29 MED ORDER — DEXAMETHASONE SODIUM PHOSPHATE 10 MG/ML IJ SOLN
INTRAMUSCULAR | Status: DC | PRN
  Administered 2023-10-29: 10 mg via INTRAVENOUS

## 2023-10-29 MED ORDER — CEFAZOLIN SODIUM-DEXTROSE 2-4 GM/100ML-% IV SOLN
2.0000 g | INTRAVENOUS | Status: AC
  Administered 2023-10-29: 2 g via INTRAVENOUS
  Filled 2023-10-29: qty 100

## 2023-10-29 MED ORDER — FENTANYL CITRATE PF 50 MCG/ML IJ SOSY: 25.0000 ug | PREFILLED_SYRINGE | INTRAMUSCULAR | Status: DC | PRN

## 2023-10-29 MED ORDER — PROPOFOL 10 MG/ML IV BOLUS
INTRAVENOUS | Status: AC
  Filled 2023-10-29: qty 20

## 2023-10-29 MED ORDER — LACTATED RINGERS IV SOLN: INTRAVENOUS | Status: DC

## 2023-10-29 MED ORDER — ACETAMINOPHEN 650 MG RE SUPP: 650.0000 mg | RECTAL | Status: DC | PRN

## 2023-10-29 MED ORDER — LIDOCAINE 2% (20 MG/ML) 5 ML SYRINGE
INTRAMUSCULAR | Status: DC | PRN
  Administered 2023-10-29: 80 mg via INTRAVENOUS

## 2023-10-29 MED ORDER — CHLORHEXIDINE GLUCONATE 0.12 % MT SOLN
15.0000 mL | Freq: Once | OROMUCOSAL | Status: AC
  Administered 2023-10-29: 15 mL via OROMUCOSAL

## 2023-10-29 SURGICAL SUPPLY — 20 items
BAG URO CATCHER STRL LF (MISCELLANEOUS) ×1 IMPLANT
BASKET STONE NCOMPASS (UROLOGICAL SUPPLIES) IMPLANT
CATH URETERAL DUAL LUMEN 10F (MISCELLANEOUS) IMPLANT
CATH URETL OPEN 5X70 (CATHETERS) IMPLANT
CLOTH BEACON ORANGE TIMEOUT ST (SAFETY) ×1 IMPLANT
EXTRACTOR STONE NITINOL NGAGE (UROLOGICAL SUPPLIES) IMPLANT
GLOVE SURG SS PI 8.0 STRL IVOR (GLOVE) ×1 IMPLANT
GOWN STRL SURGICAL XL XLNG (GOWN DISPOSABLE) ×1 IMPLANT
GUIDEWIRE STR DUAL SENSOR (WIRE) ×1 IMPLANT
KIT BALLN UROMAX 15FX4 (MISCELLANEOUS) IMPLANT
KIT TURNOVER KIT A (KITS) IMPLANT
LASER FIB FLEXIVA PULSE ID 550 (Laser) IMPLANT
LASER FIB FLEXIVA PULSE ID 910 (Laser) IMPLANT
MANIFOLD NEPTUNE II (INSTRUMENTS) ×1 IMPLANT
PACK CYSTO (CUSTOM PROCEDURE TRAY) ×1 IMPLANT
SHEATH NAVIGATOR HD 11/13X36 (SHEATH) IMPLANT
STENT URET 6FRX24 CONTOUR (STENTS) IMPLANT
TRACTIP FLEXIVA PULS ID 200XHI (Laser) IMPLANT
TUBING CONNECTING 10 (TUBING) ×1 IMPLANT
TUBING UROLOGY SET (TUBING) ×1 IMPLANT

## 2023-10-29 NOTE — Interval H&P Note (Signed)
 History and Physical Interval Note:  10/29/2023 3:59 PM  Tracy Barnett  has presented today for surgery, with the diagnosis of RIGHT UVJ STONE.  The various methods of treatment have been discussed with the patient and family. After consideration of risks, benefits and other options for treatment, the patient has consented to  Procedure(s): CYSTOSCOPY/URETEROSCOPY/HOLMIUM LASER/STENT PLACEMENT (Right) as a surgical intervention.  The patient's history has been reviewed, patient examined, no change in status, stable for surgery.  I have reviewed the patient's chart and labs.  Questions were answered to the patient's satisfaction.     Mishka Stegemann

## 2023-10-29 NOTE — Anesthesia Preprocedure Evaluation (Addendum)
 Anesthesia Evaluation  Patient identified by MRN, date of birth, ID band Patient awake    Reviewed: Allergy & Precautions, NPO status , Patient's Chart, lab work & pertinent test results  Airway Mallampati: II  TM Distance: >3 FB Neck ROM: Full    Dental  (+) Dental Advisory Given, Teeth Intact   Pulmonary neg pulmonary ROS, neg recent URI   Pulmonary exam normal breath sounds clear to auscultation       Cardiovascular hypertension, Pt. on home beta blockers and Pt. on medications  Rhythm:Regular Rate:Normal + Systolic murmurs CT Coronary Ca2++ 06/2022 1. Coronary artery calcium score of 12.9. This places the patient in the 49th percentile for subjects of the same age, gender and race/ethnicity who are free of clinical cardiovascular disease and treated diabetes. 2. Low-density lesions in the liver largest measuring 2.1 cm. The larger areas show clear evidence of water density. Liver incompletely evaluated. No priors available for comparison. Many of these areas are clearly hepatic cysts. Favor other small areas of low attenuation to be benign as well. Given lack of prior imaging available would suggest comparison with previous imaging or follow-up short interval abdominal sonogram given history of breast cancer. These are favored to be benign. 3. Aortic atherosclerosis and coronary artery disease.    Neuro/Psych negative neurological ROS     GI/Hepatic Neg liver ROS,GERD  Controlled,,  Endo/Other  negative endocrine ROS    Renal/GU Renal disease     Musculoskeletal negative musculoskeletal ROS (+)    Abdominal   Peds  Hematology negative hematology ROS (+)   Anesthesia Other Findings   Reproductive/Obstetrics                             Anesthesia Physical Anesthesia Plan  ASA: 2  Anesthesia Plan: General   Post-op Pain Management: Ofirmev  IV (intra-op)* and Minimal or no pain  anticipated   Induction: Intravenous  PONV Risk Score and Plan: 4 or greater and Ondansetron , Dexamethasone  and Treatment may vary due to age or medical condition  Airway Management Planned: LMA  Additional Equipment:   Intra-op Plan:   Post-operative Plan: Extubation in OR  Informed Consent: I have reviewed the patients History and Physical, chart, labs and discussed the procedure including the risks, benefits and alternatives for the proposed anesthesia with the patient or authorized representative who has indicated his/her understanding and acceptance.     Dental advisory given  Plan Discussed with: CRNA  Anesthesia Plan Comments:         Anesthesia Quick Evaluation

## 2023-10-29 NOTE — H&P (Signed)
 I have ureteral stone.  HPI: Tracy Barnett is a 73 year-old female patient who was referred by Dr. Jearldine Mina, MD who is here for ureteral stone.    10/29/23: Tracy Barnett is a 73 yo female who had the onset about a week ago of right flank pain that was moderate without nausea. she had a prior episode in 2008 and had ureteroscopy in [place]. She has mild low back pain today. she was in the ER on Friday and was found to have a 3x35mm right UVJ stone. She has had no hematuria but she has had urgency and frequency. She was found to have pyuria and was given cipro. She has had no fever. Her UA today is clear. She is supposed to leave on a trip tomorrow.      ALLERGIES: Bee stings Neomycin    MEDICATIONS: amLODIPine Besylate 5 MG Tablet  Atorvastatin Calcium 10 MG Tablet  Claritin 10 MG Capsule  Glucosamine  Metoprolol Succinate ER 25 MG Tablet Extended Release 24 Hour  Multivitamin  PreserVision AREDS 2  Tums Ultra 1000  Vitamin C  Vitamin D3     GU PSH: None     PSH Notes: Mastectomy both breast  Kidney stone   NON-GU PSH: Bilateral Tubal Ligation Breast mastectomy Cataract surgery D&&C     GU PMH: None   NON-GU PMH: Anxiety Arthritis Breast Cancer, History GERD Hepatitis A Hypercholesterolemia Hypertension    FAMILY HISTORY: Death In The Family Father - Other Death In The Family Mother - Other Kidney Failure - Father nephrolithiasis - Runs in Family Polycythemia - Mother Prostate Cancer - Runs in Family Type 2 Diabetes - Mother   SOCIAL HISTORY: Marital Status: Married Preferred Language: English; Ethnicity: Not Hispanic Or Latino; Race: White Current Smoking Status: Patient has never smoked.   Tobacco Use Assessment Completed: Used Tobacco in last 30 days? Does not use smokeless tobacco. Has never drank.  Does not use drugs. Drinks 1 caffeinated drink per day.    REVIEW OF SYSTEMS:    GU Review Female:   Patient reports frequent urination, hard to  postpone urination, and get up at night to urinate. Patient denies burning /pain with urination, leakage of urine, stream starts and stops, trouble starting your stream, have to strain to urinate, and being pregnant.  Gastrointestinal (Upper):   Patient denies nausea, vomiting, and indigestion/ heartburn.  Gastrointestinal (Lower):   Patient denies diarrhea and constipation.  Constitutional:   Patient denies fever, night sweats, weight loss, and fatigue.  Skin:   Patient denies skin rash/ lesion and itching.  Eyes:   Patient denies blurred vision and double vision.  Ears/ Nose/ Throat:   Patient denies sore throat and sinus problems.  Hematologic/Lymphatic:   Patient denies swollen glands and easy bruising.  Cardiovascular:   Patient denies leg swelling and chest pains.  Respiratory:   Patient denies cough and shortness of breath.  Endocrine:   Patient denies excessive thirst.  Musculoskeletal:   Patient denies back pain and joint pain.  Neurological:   Patient denies headaches and dizziness.  Psychologic:   Patient denies depression and anxiety.   Notes: Urinary tract infection    VITAL SIGNS:      10/29/2023 09:06 AM  Weight 185 lb / 83.91 kg  Height 67 in / 170.18 cm  BP 151/78 mmHg  Heart Rate 89 /min  Temperature 97.1 F / 36.1 C  BMI 29.0 kg/m   MULTI-SYSTEM PHYSICAL EXAMINATION:    Constitutional: Well-nourished. No physical deformities.  Normally developed. Good grooming.  Neck: Neck symmetrical, not swollen. Normal tracheal position.  Respiratory: Normal breath sounds. No labored breathing, no use of accessory muscles.   Cardiovascular: Regular rate and rhythm. No murmur, no gallop. .   Skin: No paleness, no jaundice, no cyanosis. No lesion, no ulcer, no rash.  Neurologic / Psychiatric: Oriented to time, oriented to place, oriented to person. No depression, no anxiety, no agitation.  Gastrointestinal: No mass, no tenderness, no rigidity, non obese abdomen.    Musculoskeletal: Normal gait and station of head and neck.     Complexity of Data:  Records Review:   Previous Doctor Records  Urine Test Review:   Urinalysis  X-Ray Review: [facility] Protocol: Reviewed Films. Reviewed Report. Discussed With Patient.    Notes:                     culture is pending.    PROCEDURES:          Urinalysis Dipstick Dipstick Cont'd  Color: Yellow Bilirubin: Neg mg/dL  Appearance: Clear Ketones: Neg mg/dL  Specific Gravity: 1.610 Blood: Neg ery/uL  pH: <=5.0 Protein: Trace mg/dL  Glucose: Neg mg/dL Urobilinogen: 0.2 mg/dL    Nitrites: Neg    Leukocyte Esterase: Neg leu/uL    ASSESSMENT:      ICD-10 Details  1 GU:   Ureteral calculus - N20.1 Acute, Systemic Symptoms - She has a 3x52mm RUVJ stone with symptoms. I discussed MET, URS and ESWL. She would like to go ahead with stone removal.   I have reviewed the risks of ureteroscopy including bleeding, infection, ureteral injury, need for a stent or secondary procedures, thrombotic events and anesthetic complications.    2   Flank Pain - R10.84 Acute, Systemic Symptoms  3   Renal calculus - N20.0 Bilateral, Minor - She has bilateral lower pole stones. Her prior stone was calcium oxalate based on the dietary guidelines she was given. She will need a metabolic w/u when she returns from her travels.    PLAN:            Medications New Meds: Tamsulosin  HCl 0.4 MG Capsule 1 capsule PO Daily   #30  1 Refill(s)  Ondansetron  4 MG Tablet Disintegrating 1 tablet PO Q 6 H PRN   #15  0 Refill(s)  oxyCODONE -Acetaminophen  5-325 MG Tablet 1 tablet PO Q 6 H   #15  0 Refill(s)  Pharmacy Name:  [facility]  Address:  [address]   [place], [place] [address]  Phone:  [phone]  Fax:  [phone]            Document Letter(s):  Created for Patient: Clinical Summary   Created for Patient: Clinical Summary         Notes:   CC: Dr. Jearldine Mina.         Next Appointment:      Next Appointment:  11/29/2023 02:15 PM    Appointment Type: Renal Ultrasound    Location: [facility] [phone]    Provider: Radiology Rm1 Radiology Rm 1    Reason for Visit: LIMITED (R) RENAL US 

## 2023-10-29 NOTE — Transfer of Care (Signed)
 Immediate Anesthesia Transfer of Care Note  Patient: Tracy Barnett  Procedure(s) Performed: CYSTOSCOPY/URETEROSCOPY/STENT PLACEMENT RIGHT (Right)  Patient Location: PACU  Anesthesia Type:General  Level of Consciousness: awake, alert , and oriented  Airway & Oxygen Therapy: Patient Spontanous Breathing and Patient connected to nasal cannula oxygen  Post-op Assessment: Report given to RN and Post -op Vital signs reviewed and stable  Post vital signs: Reviewed and stable  Last Vitals:  Vitals Value Taken Time  BP 144/89 10/29/23 1824  Temp    Pulse 108 10/29/23 1827  Resp 13 10/29/23 1827  SpO2 99 % 10/29/23 1827  Vitals shown include unfiled device data.  Last Pain:  Vitals:   10/29/23 1620  TempSrc: Oral  PainSc:          Complications: No notable events documented.

## 2023-10-29 NOTE — Op Note (Addendum)
 Preoperative diagnosis: right ureteral calculus  Postoperative diagnosis: right ureteral calculus  Procedure:  Cystoscopy right ureteroscopy and stone removal Right ureteral balloon dilation right 80F x 24 ureteral stent placement  Fluoroscopy less than 1 hour with interpretation  Surgeon: Homero Luster, MD  Assistant: Alphonza Ashing, MD  Anesthesia: General  Complications: None  Intraoperative findings:  - moderate cystocele - tight right ureteral orifice requiring balloon dilation - right UVJ stone basket extracted  EBL: Minimal  Specimens: right ureteral calculus  Disposition of specimens: [facility] for stone analysis  Indication: Tracy Barnett is a 73 y.o.   patient with a right ureteral stone and associated right symptoms. After reviewing the management options for treatment, the patient elected to proceed with the above surgical procedure(s). We have discussed the potential benefits and risks of the procedure, side effects of the proposed treatment, the likelihood of the patient achieving the goals of the procedure, and any potential problems that might occur during the procedure or recuperation. Informed consent has been obtained.   Description of procedure:  The patient was taken to the operating room and general anesthesia was induced.  The patient was placed in the dorsal lithotomy position, prepped and draped in the usual sterile fashion, and preoperative antibiotics were administered. A preoperative time-out was performed.   Cystourethroscopy was performed.  The patient's urethra was examined and was normal. he bladder was then systematically examined in its entirety. There was no evidence for any bladder tumors or stones.    Attention then turned to the right ureteral orifice. We utilized the 70 degree lens and manual cystocele reduction to visualize the orifice, and eventually were able to advance a sensor wire into the renal pelvis under fluoroscopic  guidance. Over the wire we passed the 4cm ureteral dilation balloon and dilated the ureteral orifice under fluoroscopy to 15 atmospheres. The balloon was deflated and removed. We removed the cystoscope and advanced the 6 Fr semirigid ureteroscope into the ureter next to the wire and immediately identified the stone. An engage basket was introduced and the stone was grasped and removed in its entirety. We repeated ureteroscopy and noted no further fragments to the mid ureter. The scope was removed, leaving the wire in place.  A 6x24cm ureteral stent with string was passed over the wire to the renal pelvis under fluoroscopy. The wire was removed and an adequate curl was visualized fluoroscopically and on cystoscopy to empty the bladder. The string was tied and tucked into the patient's vagina.   The patient appeared to tolerate the procedure well and without complications.  The patient was able to be awakened and transferred to the recovery unit in satisfactory condition.   Disposition: The tether of the stent was left on and tucked inside the patient's vagina.  Instructions for removing the stent have been provided to the patient. The patient has been scheduled for followup in 6 weeks with a renal ultrasound.

## 2023-10-29 NOTE — Anesthesia Procedure Notes (Signed)
 Procedure Name: LMA Insertion Date/Time: 10/29/2023 5:36 PM  Performed by: [place] Cid, CRNAPre-anesthesia Checklist: Emergency Drugs available, Patient identified, Suction available, Patient being monitored and Timeout performed Patient Re-evaluated:Patient Re-evaluated prior to induction Oxygen Delivery Method: Circle system utilized Preoxygenation: Pre-oxygenation with 100% oxygen Induction Type: IV induction Ventilation: Mask ventilation without difficulty LMA: LMA inserted LMA Size: 4.0 Tube type: Oral Number of attempts: 1 Placement Confirmation: positive ETCO2 and breath sounds checked- equal and bilateral Secured at: 22 cm Tube secured with: Tape Dental Injury: Teeth and Oropharynx as per pre-operative assessment

## 2023-10-29 NOTE — Interval H&P Note (Signed)
 History and Physical Interval Note:  10/29/2023 5:18 PM  Tracy Barnett  has presented today for surgery, with the diagnosis of RIGHT UVJ STONE.  The various methods of treatment have been discussed with the patient and family. After consideration of risks, benefits and other options for treatment, the patient has consented to  Procedure(s): CYSTOSCOPY/URETEROSCOPY/HOLMIUM LASER/STENT PLACEMENT (Right) as a surgical intervention.  The patient's history has been reviewed, patient examined, no change in status, stable for surgery.  I have reviewed the patient's chart and labs.  Questions were answered to the patient's satisfaction.     Shambhavi Salley

## 2023-10-30 ENCOUNTER — Encounter (HOSPITAL_COMMUNITY): Payer: Self-pay | Admitting: Urology

## 2023-10-30 NOTE — Anesthesia Postprocedure Evaluation (Signed)
 Anesthesia Post Note  Patient: Tracy Barnett  Procedure(s) Performed: CYSTOSCOPY/URETEROSCOPY/STENT PLACEMENT RIGHT (Right)     Patient location during evaluation: PACU Anesthesia Type: General Level of consciousness: sedated and patient cooperative Pain management: pain level controlled Vital Signs Assessment: post-procedure vital signs reviewed and stable Respiratory status: spontaneous breathing Cardiovascular status: stable Anesthetic complications: no  No notable events documented.  Last Vitals:  Vitals:   10/29/23 1845 10/29/23 1900  BP: (!) 145/82 (!) 144/80  Pulse: 91 83  Resp: 10 10  Temp:  36.4 C  SpO2: 96% 97%    Last Pain:  Vitals:   10/29/23 1900  TempSrc:   PainSc: 2                  Gorman Laughter

## 2023-11-29 DIAGNOSIS — N202 Calculus of kidney with calculus of ureter: Secondary | ICD-10-CM | POA: Diagnosis not present

## 2024-01-14 DIAGNOSIS — R21 Rash and other nonspecific skin eruption: Secondary | ICD-10-CM | POA: Diagnosis not present

## 2024-02-14 DIAGNOSIS — H9190 Unspecified hearing loss, unspecified ear: Secondary | ICD-10-CM | POA: Diagnosis not present

## 2024-02-14 DIAGNOSIS — F411 Generalized anxiety disorder: Secondary | ICD-10-CM | POA: Diagnosis not present

## 2024-02-14 DIAGNOSIS — M8580 Other specified disorders of bone density and structure, unspecified site: Secondary | ICD-10-CM | POA: Diagnosis not present

## 2024-02-14 DIAGNOSIS — E038 Other specified hypothyroidism: Secondary | ICD-10-CM | POA: Diagnosis not present

## 2024-02-14 DIAGNOSIS — G250 Essential tremor: Secondary | ICD-10-CM | POA: Diagnosis not present

## 2024-02-14 DIAGNOSIS — Z0000 Encounter for general adult medical examination without abnormal findings: Secondary | ICD-10-CM | POA: Diagnosis not present

## 2024-02-14 DIAGNOSIS — Z853 Personal history of malignant neoplasm of breast: Secondary | ICD-10-CM | POA: Diagnosis not present

## 2024-02-14 DIAGNOSIS — E559 Vitamin D deficiency, unspecified: Secondary | ICD-10-CM | POA: Diagnosis not present

## 2024-02-14 DIAGNOSIS — R7309 Other abnormal glucose: Secondary | ICD-10-CM | POA: Diagnosis not present

## 2024-02-14 DIAGNOSIS — Z1331 Encounter for screening for depression: Secondary | ICD-10-CM | POA: Diagnosis not present

## 2024-02-14 DIAGNOSIS — I10 Essential (primary) hypertension: Secondary | ICD-10-CM | POA: Diagnosis not present

## 2024-02-14 DIAGNOSIS — E785 Hyperlipidemia, unspecified: Secondary | ICD-10-CM | POA: Diagnosis not present

## 2024-02-19 DIAGNOSIS — L578 Other skin changes due to chronic exposure to nonionizing radiation: Secondary | ICD-10-CM | POA: Diagnosis not present

## 2024-02-19 DIAGNOSIS — L570 Actinic keratosis: Secondary | ICD-10-CM | POA: Diagnosis not present

## 2024-02-19 DIAGNOSIS — L538 Other specified erythematous conditions: Secondary | ICD-10-CM | POA: Diagnosis not present

## 2024-02-19 DIAGNOSIS — L821 Other seborrheic keratosis: Secondary | ICD-10-CM | POA: Diagnosis not present

## 2024-02-19 DIAGNOSIS — L820 Inflamed seborrheic keratosis: Secondary | ICD-10-CM | POA: Diagnosis not present

## 2024-02-19 DIAGNOSIS — L853 Xerosis cutis: Secondary | ICD-10-CM | POA: Diagnosis not present

## 2024-04-03 DIAGNOSIS — Z23 Encounter for immunization: Secondary | ICD-10-CM | POA: Diagnosis not present

## 2024-05-07 DIAGNOSIS — I10 Essential (primary) hypertension: Secondary | ICD-10-CM | POA: Diagnosis not present

## 2024-05-07 DIAGNOSIS — G4730 Sleep apnea, unspecified: Secondary | ICD-10-CM | POA: Diagnosis not present

## 2024-05-26 DIAGNOSIS — N200 Calculus of kidney: Secondary | ICD-10-CM | POA: Diagnosis not present
